# Patient Record
Sex: Male | Born: 2000 | Smoking: Never smoker
Health system: Southern US, Community
[De-identification: ages and names within clinical notes are randomized; demographics above are authoritative.]

## PROBLEM LIST (undated history)

## (undated) DIAGNOSIS — R55 Syncope and collapse: Secondary | ICD-10-CM

## (undated) HISTORY — PX: JOINT REPLACEMENT: SHX530

## (undated) HISTORY — PX: OTHER SURGICAL HISTORY: SHX169

---

## 2020-01-02 ENCOUNTER — Emergency Department: Payer: Self-pay

## 2020-01-02 ENCOUNTER — Emergency Department
Admission: EM | Admit: 2020-01-02 | Discharge: 2020-01-02 | Disposition: A | Discharge status: Home / Self Care | Payer: Self-pay | Attending: Emergency Medicine | Admitting: Emergency Medicine

## 2020-01-02 ENCOUNTER — Other Ambulatory Visit: Payer: Self-pay

## 2020-01-02 DIAGNOSIS — Z5321 Procedure and treatment not carried out due to patient leaving prior to being seen by health care provider: Secondary | ICD-10-CM | POA: Insufficient documentation

## 2020-01-02 DIAGNOSIS — M542 Cervicalgia: Secondary | ICD-10-CM | POA: Insufficient documentation

## 2020-01-02 DIAGNOSIS — Y92410 Unspecified street and highway as the place of occurrence of the external cause: Secondary | ICD-10-CM | POA: Insufficient documentation

## 2020-01-02 DIAGNOSIS — M25512 Pain in left shoulder: Secondary | ICD-10-CM | POA: Insufficient documentation

## 2020-01-02 HISTORY — DX: Syncope and collapse: R55

## 2020-01-02 NOTE — ED Notes (Signed)
To stat desk stating they are leaving to go somewhere closer to home.  Ambulated out of ED lobby
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
